# Patient Record
Sex: Male | Born: 1980 | Race: White | Hispanic: No | Marital: Married | Smoking: Current every day smoker
Health system: Southern US, Community
[De-identification: ages and names within clinical notes are randomized; demographics above are authoritative.]

---

## 2015-11-21 ENCOUNTER — Encounter (HOSPITAL_COMMUNITY): Payer: Self-pay

## 2015-11-21 ENCOUNTER — Emergency Department (HOSPITAL_COMMUNITY): Payer: Self-pay

## 2015-11-21 ENCOUNTER — Emergency Department (HOSPITAL_COMMUNITY)
Admission: EM | Admit: 2015-11-21 | Discharge: 2015-11-21 | Disposition: A | Discharge status: Home / Self Care | Payer: Self-pay | Attending: Emergency Medicine | Admitting: Emergency Medicine

## 2015-11-21 DIAGNOSIS — R6883 Chills (without fever): Secondary | ICD-10-CM | POA: Insufficient documentation

## 2015-11-21 DIAGNOSIS — N50812 Left testicular pain: Secondary | ICD-10-CM | POA: Insufficient documentation

## 2015-11-21 DIAGNOSIS — F17200 Nicotine dependence, unspecified, uncomplicated: Secondary | ICD-10-CM | POA: Insufficient documentation

## 2015-11-21 DIAGNOSIS — R590 Localized enlarged lymph nodes: Secondary | ICD-10-CM | POA: Insufficient documentation

## 2015-11-21 DIAGNOSIS — Z79899 Other long term (current) drug therapy: Secondary | ICD-10-CM | POA: Insufficient documentation

## 2015-11-21 DIAGNOSIS — R300 Dysuria: Secondary | ICD-10-CM | POA: Insufficient documentation

## 2015-11-21 DIAGNOSIS — R102 Pelvic and perineal pain: Secondary | ICD-10-CM | POA: Insufficient documentation

## 2015-11-21 DIAGNOSIS — N50811 Right testicular pain: Secondary | ICD-10-CM | POA: Insufficient documentation

## 2015-11-21 DIAGNOSIS — R231 Pallor: Secondary | ICD-10-CM | POA: Insufficient documentation

## 2015-11-21 LAB — URINALYSIS, ROUTINE W REFLEX MICROSCOPIC
Bilirubin Urine: NEGATIVE
Glucose, UA: 100 mg/dL — AB
Hgb urine dipstick: NEGATIVE
Ketones, ur: NEGATIVE mg/dL
LEUKOCYTES UA: NEGATIVE
Nitrite: NEGATIVE
PROTEIN: NEGATIVE mg/dL
Specific Gravity, Urine: >1.03 — ABNORMAL HIGH (ref 1.005–1.030)
pH: 6 (ref 5.0–8.0)

## 2015-11-21 LAB — CBC WITH DIFFERENTIAL/PLATELET
BASOS ABS: 0 10*3/uL (ref 0.0–0.1)
Basophils Relative: 0 %
EOS ABS: 0.1 10*3/uL (ref 0.0–0.7)
EOS PCT: 2 %
HCT: 41.7 % (ref 39.0–52.0)
Hemoglobin: 14 g/dL (ref 13.0–17.0)
LYMPHS PCT: 33 %
Lymphs Abs: 2.4 10*3/uL (ref 0.7–4.0)
MCH: 31 pg (ref 26.0–34.0)
MCHC: 33.6 g/dL (ref 30.0–36.0)
MCV: 92.5 fL (ref 78.0–100.0)
Monocytes Absolute: 0.7 10*3/uL (ref 0.1–1.0)
Monocytes Relative: 9 %
NEUTROS PCT: 56 %
Neutro Abs: 4.1 10*3/uL (ref 1.7–7.7)
PLATELETS: 200 10*3/uL (ref 150–400)
RBC: 4.51 MIL/uL (ref 4.22–5.81)
RDW: 12.8 % (ref 11.5–15.5)
WBC: 7.3 10*3/uL (ref 4.0–10.5)

## 2015-11-21 LAB — COMPREHENSIVE METABOLIC PANEL
ALT: 11 U/L — ABNORMAL LOW (ref 17–63)
AST: 19 U/L (ref 15–41)
Albumin: 4.1 g/dL (ref 3.5–5.0)
Alkaline Phosphatase: 63 U/L (ref 38–126)
Anion gap: 6 (ref 5–15)
BILIRUBIN TOTAL: 0.4 mg/dL (ref 0.3–1.2)
BUN: 21 mg/dL — AB (ref 6–20)
CO2: 27 mmol/L (ref 22–32)
CREATININE: 0.75 mg/dL (ref 0.61–1.24)
Calcium: 9.1 mg/dL (ref 8.9–10.3)
Chloride: 108 mmol/L (ref 101–111)
Glucose, Bld: 161 mg/dL — ABNORMAL HIGH (ref 65–99)
POTASSIUM: 4.3 mmol/L (ref 3.5–5.1)
Sodium: 141 mmol/L (ref 135–145)
TOTAL PROTEIN: 6.8 g/dL (ref 6.5–8.1)

## 2015-11-21 MED ORDER — SODIUM CHLORIDE 0.9 % IV BOLUS (SEPSIS)
1000.0000 mL | Freq: Once | INTRAVENOUS | Status: AC
  Administered 2015-11-21: 1000 mL via INTRAVENOUS

## 2015-11-21 MED ORDER — HYDROCODONE-ACETAMINOPHEN 5-325 MG PO TABS: 1.0000 | ORAL_TABLET | Freq: Four times a day (QID) | ORAL | Status: DC | PRN

## 2015-11-21 MED ORDER — HYDROCODONE-ACETAMINOPHEN 5-325 MG PO TABS
1.0000 | ORAL_TABLET | Freq: Four times a day (QID) | ORAL | Status: AC | PRN
Start: 2015-11-21 — End: ?

## 2015-11-21 MED ORDER — FENTANYL CITRATE (PF) 100 MCG/2ML IJ SOLN
50.0000 ug | Freq: Once | INTRAMUSCULAR | Status: AC
  Administered 2015-11-21: 50 ug via INTRAVENOUS
  Filled 2015-11-21: qty 2

## 2015-11-21 MED ORDER — IOHEXOL 300 MG/ML  SOLN
100.0000 mL | Freq: Once | INTRAMUSCULAR | Status: AC | PRN
  Administered 2015-11-21: 100 mL via INTRAVENOUS

## 2015-11-21 MED ORDER — IOHEXOL 300 MG/ML  SOLN
50.0000 mL | Freq: Once | INTRAMUSCULAR | Status: AC | PRN
  Administered 2015-11-21: 50 mL via ORAL

## 2015-11-21 NOTE — ED Notes (Signed)
Having pain in my left groin, a knot in my right groin, pain in my testicles, and having painful urination with inability to start urination without pushing it out.  Started a couple of weeks ago with pain in the left groin and the pain has increased over the past two days.

## 2015-11-21 NOTE — Discharge Instructions (Signed)
As discussed, today's evaluation has been largely reassuring.  However, with your ongoing pain in the groin area it is very important that you follow-up with our urology colleagues for further evaluation and management.  Return here for concerning changes in your condition.  [employer] 211 is a great source of information about community services available.  Access by dialing 2-1-1 from anywhere in [place], or by website -  PooledIncome.pl.   Other Local Resources (Updated 09/2015)  Financial Assistance   Services    Phone Number and Address  [facility]  Low-cost medical care - 1st and 3rd Saturday of every month  Must not qualify for public or private insurance and must have limited income [phone] [address] [place], [place]    [place] [employer] of Social Services  Child care  Emergency assistance for housing and [employer]  Medicaid [phone] [address] [place], [place] [address]   [facility]  Low-cost medical care for children, communicable diseases, sexually-transmitted diseases, immunizations, maternity care, womens health and family planning [phone] [address] [place], [place] [address]  [facility]   Medication assistance for [place] residents  Must meet income requirements [phone] [address] [place], [place].    [facility] Social Services  Child care  Emergency assistance for housing and [employer]  Medicaid [phone] [address] [place], [place] [address]  [facility]   Low-cost medical care,   Monday through Friday, 9 am to 6 pm.   Accepts Medicare/Medicaid, and self-pay [phone] [address] [place], [place] [address]  [facility]  Low-cost medical  care - Monday through Friday, 8:30 am - 5:30 pm  Accepts Medicaid and self-pay [phone] [address] [place], [place] [address]   [facility]  Primary medical care, including for those with sickle cell disease  Accepts Medicare, Medicaid, insurance and self-pay [phone] [address] [place], [place]  [facility]   Primary medical care  Accepts Medicare, [place], insurance and self-pay [phone] [address] [place], [place] [address]   [facility] of Social Services  Child care  Emergency assistance for housing and [employer]  Medicaid [phone] [address] [place], [place] [address]  [facility] Department of Health and [employer]  Child care  Emergency assistance for housing and [employer]  Medicaid [phone] [address] [place], [place] [address]   [facility] Medication Assistance Program  Medication assistance for [facility] residents with no insurance only  Must have a primary care doctor [phone] [address] [place], [place]  [facility]   Primary medical care  [place], [place], insurance  [phone] [address] [place], [place]  [facility]   Medication assistance [phone]  [facility]   Primary medical care  Accepts Medicare, [place], insurance and self-pay [phone] [address] [place], [place] [address]  [facility]   Primary medical care  Accepts Medicare, [place], insurance and self-pay [phone] [address] [place], [place] [address]  [facility]  For [place] residents between the ages of 17 and 76 who do not have any form of health insurance, Medicare, [place], or [place] benefits.  Services are provided free of charge  to uninsured patients who fall within federal  poverty guidelines.    Hours: Tuesdays and Thursdays, 4:15 - 8 pm [phone] [address], [address] [place], [place] [address]  [facility]     Primary medical care  Dental care  Nutritional counseling  Pharmacy  Accepts Medicaid, Medicare, most insurance.  Fees are adjusted based on ability to pay.   [phone] [facility] [address] [place], [place]  [phone] [facility] [address] [place], [place]  [phone] [facility] [place], [place]  [phone] [facility], [address] [place], [place]  [phone] [facility] [address] [place], [place]  [facility]  Womens health and family planning [phone] [place], [place]  [facility] Social Services  Child care  Emergency assistance for housing and [employer]ark[employer]ilities  Food stamps  Medicaid [phone], [place], [place] [address]   [facility]    Ages 2818 and older  Hours: Mondays and Thursdays, 7:00 am - 9:00 am Patients are seen on a first come, first served basis. [phone] [address] [place], [place]  [facility] of Social Services  Child care  Emergency assistance for housing and [employer]ark[employer]ilities  Food stamps  Medicaid [phone] [address] [place], [place] [address]  [facility] [employer]  Medication assistance  Rental assistance  Food pantry  Medication assistance  Housing assistance  Emergency food distribution  Utility assistance [phone] [address] [place], [place]  [phone]  [address] [place], [place] [address] Hours: Tuesdays and Thursdays from 9am - 12 noon by appointment only  [phone] [address] [place], [place] [address]  [facility] - Lanae Boastlara F. Gunn   Accepts  private insurance, [place], and [place].  Payment is based on a sliding scale for those without insurance.  Hours: Mondays, Tuesdays and Thursdays, 8:30 am - 5:30 pm.   [phone] [address] [place], [place]  [facility] - [facility] Medicine at [facility]    Accepts private insurance, [place], and [place].  Payment is based on a sliding scale for those without insurance. [phone] [address] [place], [place]  [facility] - Pediatrics at E. Scientist, research (physical sciences)[place]  Accepts private insurance, [employer]icare, and [place]icaid.  Payment is based on a sliding scale for those without insurance [phone] [address], [place], [place]  [facility] - [facility]s at [facility]  Accepts private insurance, [employer]icare, and [place].  Payment is based on a sliding scale for those without insurance. [phone] [address] [place], [place]  [facility] - Pediatrics at [facility]  Accepts private insurance, [place], and [place].  Payment is based on a sliding scale for those without insurance. [phone], [phone] [place], [place].    [facility]  Maternity care.  Accepts Medicaid and self-pay. [phone] [address] [place], [place]

## 2015-11-21 NOTE — ED Provider Notes (Signed)
CSN: [id number]     Arrival date & time 11/21/15  1908 History   First MD Initiated Contact with Patient 11/21/15 1918     Chief Complaint  Patient presents with  . Groin Pain     (Consider location/radiation/quality/duration/timing/severity/associated sxs/prior Treatment) HPI Patient presents with term groin pain, dysuria, difficulty with starting urinary stream. Symptoms began maybe 2 weeks ago, have progressed, and particular past 2 days. Patient initially had pain in the left inguinal crease, eventually began to have pain in his testicles, as well as in his lower abdomen. Subsequent, patient developed dysuria, and in the past 24 hours has noticed swelling in the right inguinal crease. He denies hematuria, discharge. No rectal symptoms, no other abdominal pain. There are associated chills. Patient denies weight loss, weight gain. Patient is a smoker  Smoking cessation provided, particularly in light of this patient's evaluation in the ED. Patient has a notable family history of multiple family members with cancer, patient's wife is actively receiving chemotherapy for T-cell lymphoma.   History reviewed. No pertinent past medical history. History reviewed. No pertinent past surgical history. No family history on file. Social History  Substance Use Topics  . Smoking status: Current Every Day Smoker  . Smokeless tobacco: None  . Alcohol Use: No    Review of Systems  Constitutional:       Per HPI, otherwise negative  HENT:       Per HPI, otherwise negative  Respiratory:       Per HPI, otherwise negative  Cardiovascular:       Per HPI, otherwise negative  Gastrointestinal: Negative for vomiting.  Endocrine:       Negative aside from HPI  Genitourinary:       Neg aside from HPI   Musculoskeletal:       Per HPI, otherwise negative  Skin: Positive for pallor.  Allergic/Immunologic: Negative for immunocompromised state.  Neurological: Negative for syncope.       Allergies  Review of patient's allergies indicates no known allergies.  Home Medications   Prior to Admission medications   Medication Sig Start Date End Date Taking? Authorizing Provider  gabapentin (NEURONTIN) 300 MG capsule Take 300 mg by mouth 3 (three) times daily.   Yes Historical Provider, MD  naproxen sodium (ALEVE) 220 MG tablet Take 220 mg by mouth daily as needed (for pain).   Yes Historical Provider, MD   BP 137/89 mmHg  Pulse 95  Temp(Src) 97.5 F (36.4 C) (Oral)  Resp 20  Ht 6\' 1"  (1.854 m)  Wt 235 lb (106.595 kg)  BMI 31.01 kg/m2  SpO2 99% Physical Exam  Constitutional: He is oriented to person, place, and time. He appears well-developed. No distress.  HENT:  Head: Normocephalic and atraumatic.  Eyes: Conjunctivae and EOM are normal.  Cardiovascular: Normal rate and regular rhythm.   Pulmonary/Chest: Effort normal. No stridor. No respiratory distress.  Abdominal: He exhibits no distension.  Genitourinary: Penis normal.    Right testis shows no mass and no tenderness. Left testis shows no mass and no tenderness.  Musculoskeletal: He exhibits no edema.  Lymphadenopathy:       Right: Inguinal adenopathy present.       Left: Inguinal adenopathy present.  Neurological: He is alert and oriented to person, place, and time.  Skin: Skin is warm and dry.  Psychiatric: He has a normal mood and affect.  Nursing note and vitals reviewed.   ED Course  Procedures (including critical care time) Labs Review Labs  Reviewed  COMPREHENSIVE METABOLIC PANEL - Abnormal; Notable for the following:    Glucose, Bld 161 (*)    BUN 21 (*)    ALT 11 (*)    All other components within normal limits  URINALYSIS, ROUTINE W REFLEX MICROSCOPIC (NOT AT [facility]) - Abnormal; Notable for the following:    Specific Gravity, Urine >1.030 (*)    Glucose, UA 100 (*)    All other components within normal limits  CBC WITH DIFFERENTIAL/PLATELET    Imaging Review Ct Abdomen Pelvis W  Contrast  11/21/2015  CLINICAL DATA:  Pain bilateral inguinal regions and bilateral testicles, painful urination, pain getting worse for 2 days EXAM: CT ABDOMEN AND PELVIS WITH CONTRAST TECHNIQUE: Multidetector CT imaging of the abdomen and pelvis was performed using the standard protocol following bolus administration of intravenous contrast. CONTRAST:  50mL OMNIPAQUE IOHEXOL 300 MG/ML SOLN, OMNIPAQUE IOHEXOL 300 MG/ML SOLN COMPARISON:  None. FINDINGS: Lower chest:  Normal Hepatobiliary: Normal Pancreas: Normal Spleen: Multiple splenules in addition to normal spleen Adrenals/Urinary Tract: Adrenals are normal. No perinephric inflammation or hydronephrosis. Along the course of the left ureter, there is a 2 mm calcification within or adjacent to the mid left ureter. It could represent a ureteral stone or a phlebolith. Based on position in the absence of hydronephrosis a phlebolith is considered more likely. Bladder is normal. Stomach/Bowel: Normal Vascular/Lymphatic: No evidence of aortic dilatation. Mild right iliac artery calcification. No significant adenopathy. Reproductive: No significant findings. Bilateral upper scrotal calcifications along the course of the vas deferens not of acute significance. Other: No ascites Musculoskeletal: No acute findings. Significant L3-4 and L4-5 degenerative disc disease. IMPRESSION: The study is felt to be negative for acute abnormalities. 2 mm calcification over the left side OS appears to be a adjacent rather than within the left ureter, which is not dilated. No evidence of perinephric inflammation or hydronephrosis. Electronically Signed   By: Esperanza Heir M.D.   On: 11/21/2015 20:42   I have personally reviewed and evaluated these images and lab results as part of my medical decision-making.   On repeat exam the patient is in no distress. I discussed all findings with him and his wife. Specifically we discussed the reassuring CT findings, labs,  ultrasound. Given the patient continues to have some pain, he will follow-up with urology.  MDM  Patient presents with ongoing groin pain. Here, the patient is awake, alert, with a largely reassuring physical exam, though there is tenderness to palpation about both sides of the inguinal area, and with some concern for adenopathy, CT scans performed to exclude deep infection. Patient is no appreciable scrotal tenderness to palpation, and testicular torsion is not likely. Patient had pain control provided here, was discharged with similar medication to follow-up with urology for further evaluation, management.   Gerhard Munch, MD 11/21/15 2101

## 2015-11-24 MED FILL — Hydrocodone-Acetaminophen Tab 5-325 MG: ORAL | Qty: 6 | Status: AC

## 2016-09-24 IMAGING — CT CT ABD-PELV W/ CM
2 of 4 series · 16 of 46 positions shown, 18 images · IV contrast (Omnipaque 300)
Comparison: None.

CLINICAL DATA: Pain bilateral inguinal regions and bilateral
testicles, painful urination, pain getting worse for 2 days

EXAM:
CT ABDOMEN AND PELVIS WITH CONTRAST
TECHNIQUE: Multidetector CT imaging of the abdomen and pelvis was performed
using the standard protocol following bolus administration of
intravenous contrast.
CONTRAST:  50mL OMNIPAQUE IOHEXOL 300 MG/ML SOLN, 100mL OMNIPAQUE
IOHEXOL 300 MG/ML SOLN

[Series 2: abd_pel_with 5.0 b40f · axial · 0.69mm/px · z∈[+636,+1106]mm · 13 of 102 slices shown, 15 images]
[im 4/102  soft-tissue]
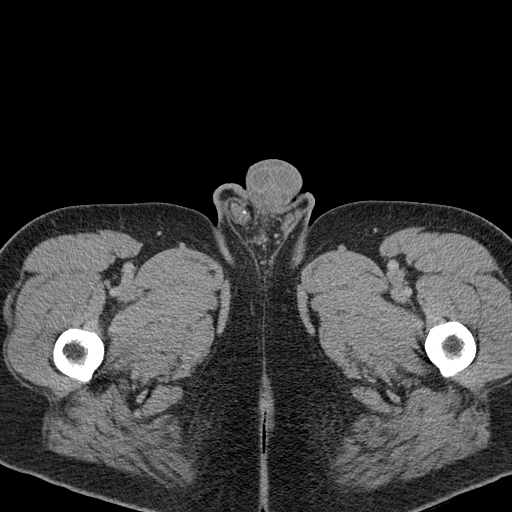
[im 4/102  bone]
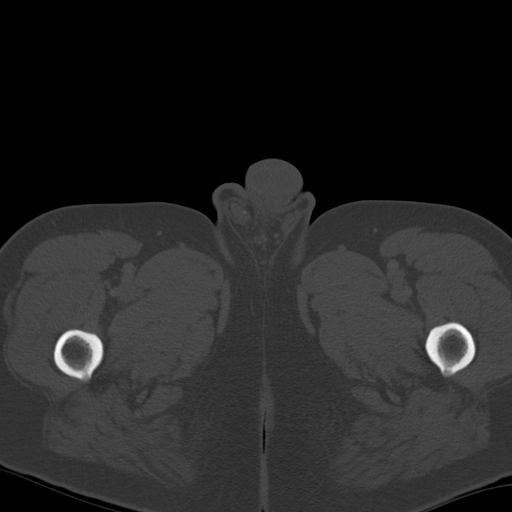
[im 12/102  soft-tissue]
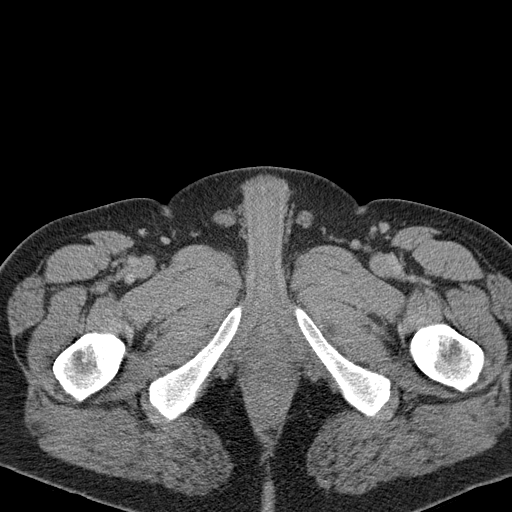
[im 20/102  soft-tissue]
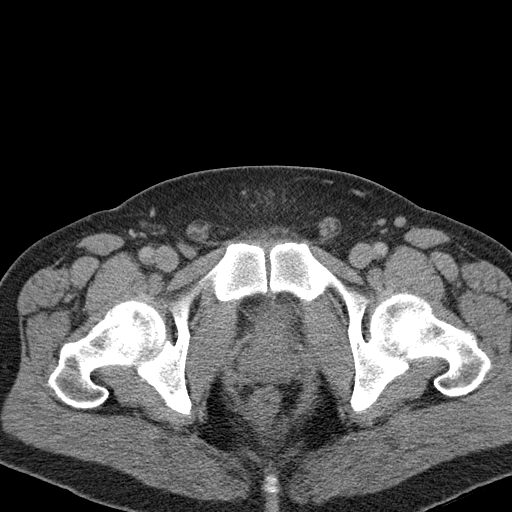
[im 28/102  soft-tissue]
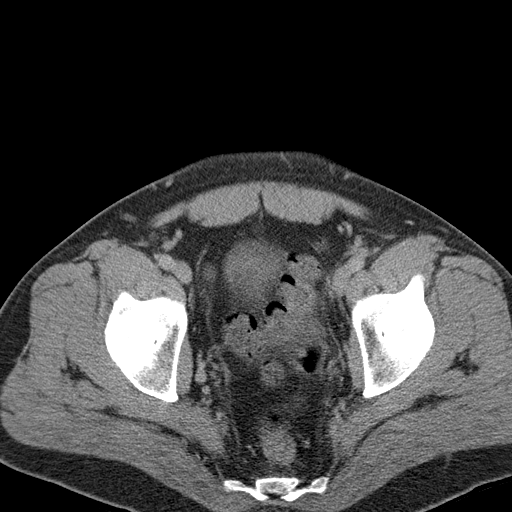
[im 35/102  soft-tissue]
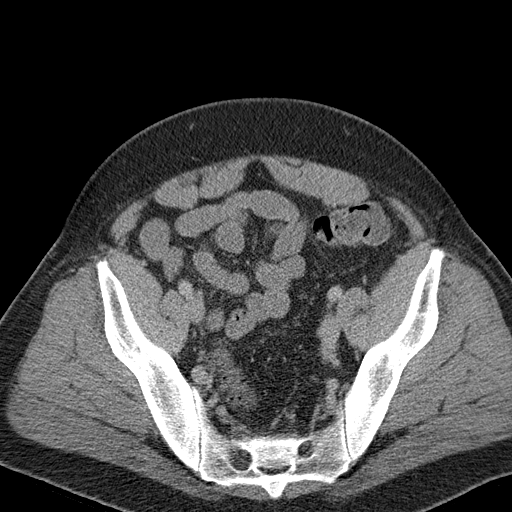
[im 43/102  soft-tissue]
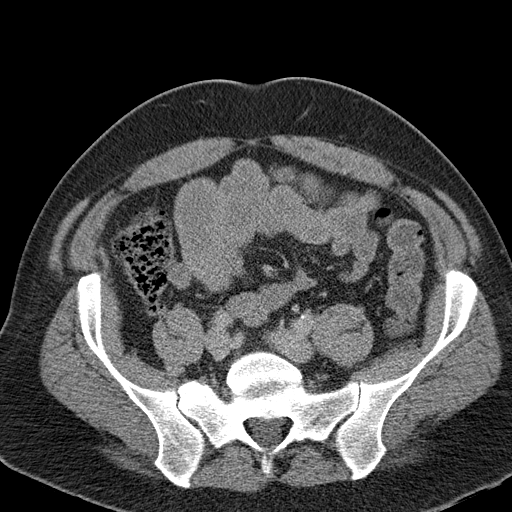
[im 51/102  soft-tissue]
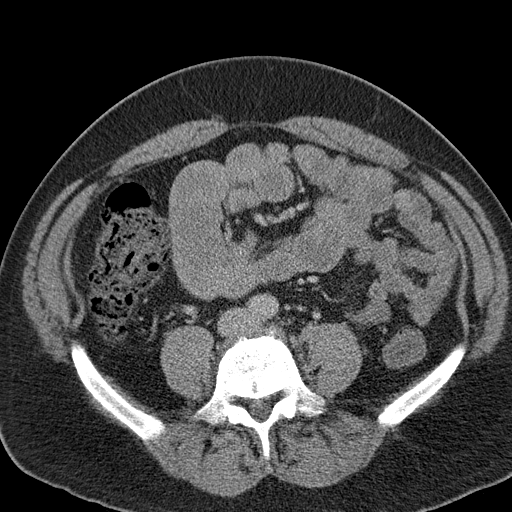
[im 59/102  soft-tissue]
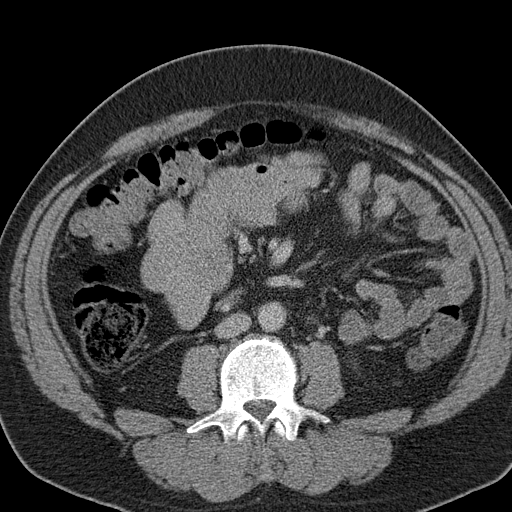
[im 67/102  soft-tissue]
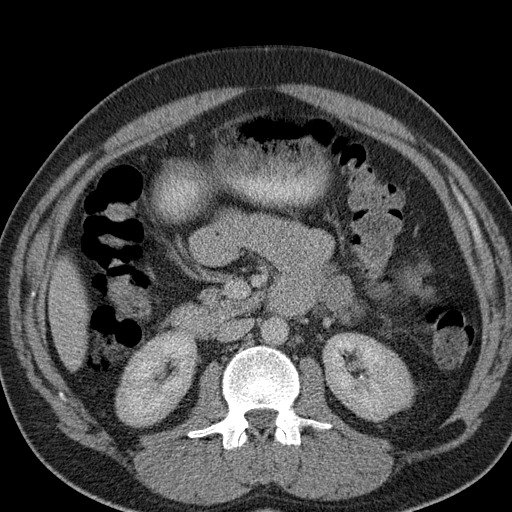
[im 67/102  bone]
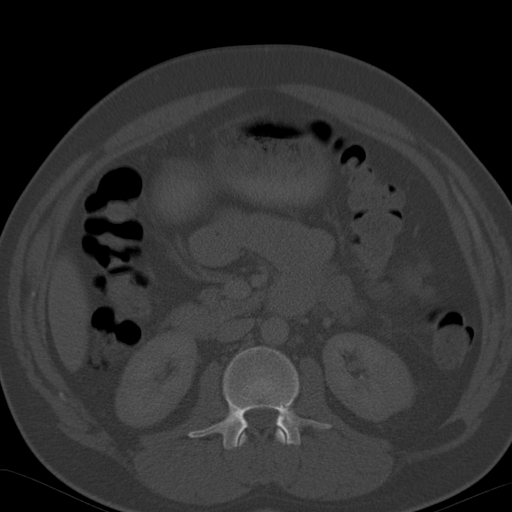
[im 74/102  soft-tissue]
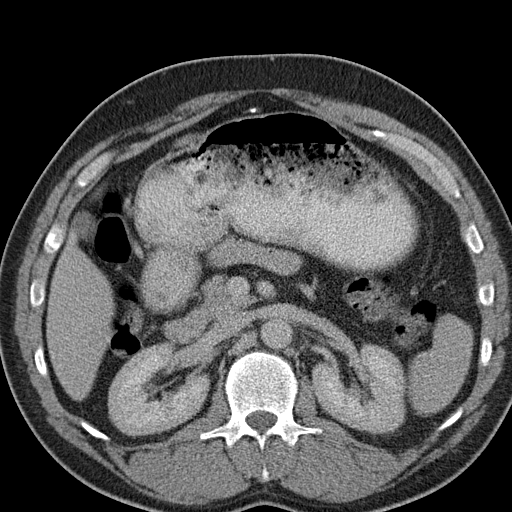
[im 82/102  soft-tissue]
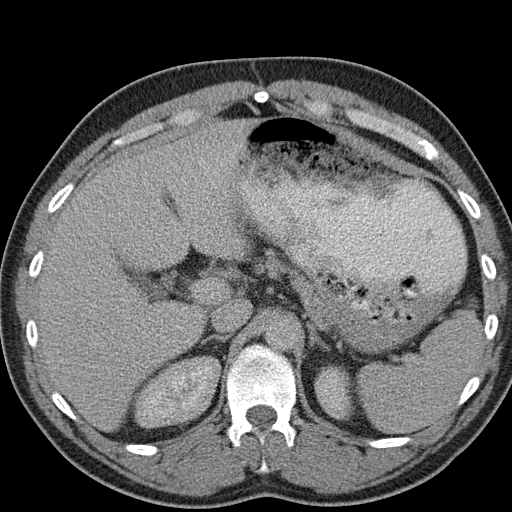
[im 90/102  soft-tissue]
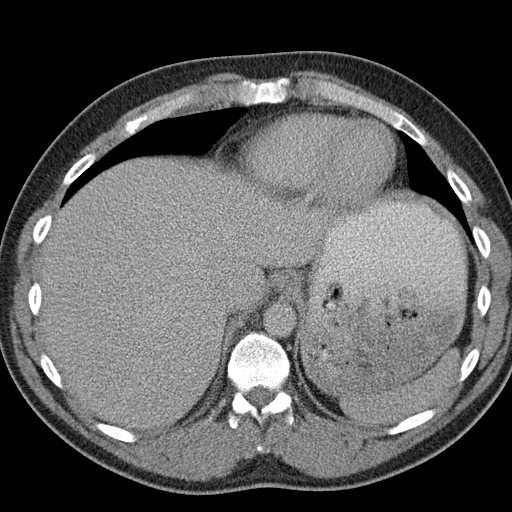
[im 98/102  soft-tissue]
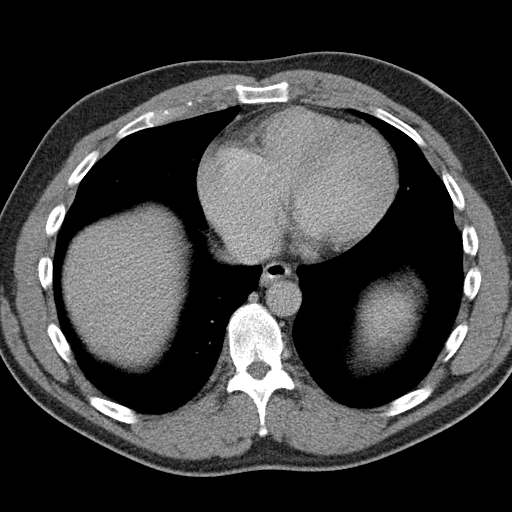

[Series 3: abd_pel_with 3.0 spo cor · coronal · 0.71mm/px · 3 of 113 slices shown]
[im 38/113  soft-tissue]
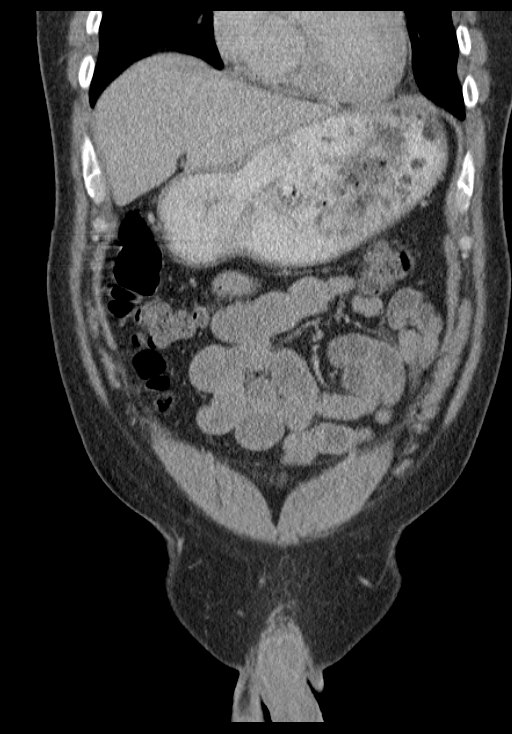
[im 50/113  soft-tissue]
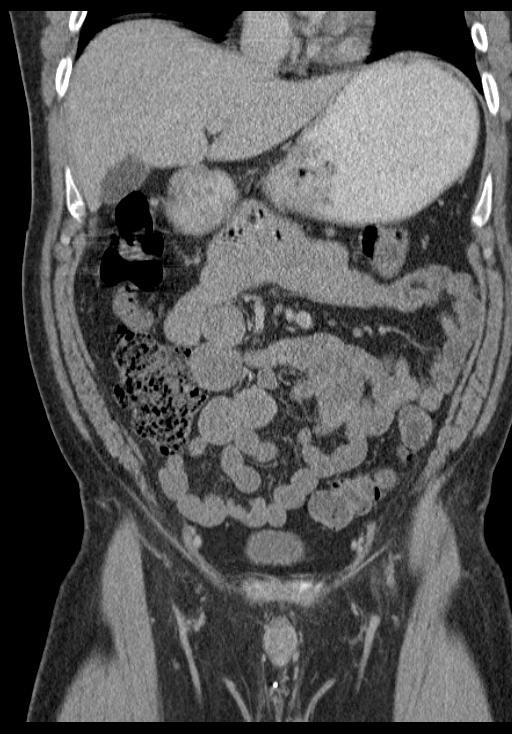
[im 63/113  soft-tissue]
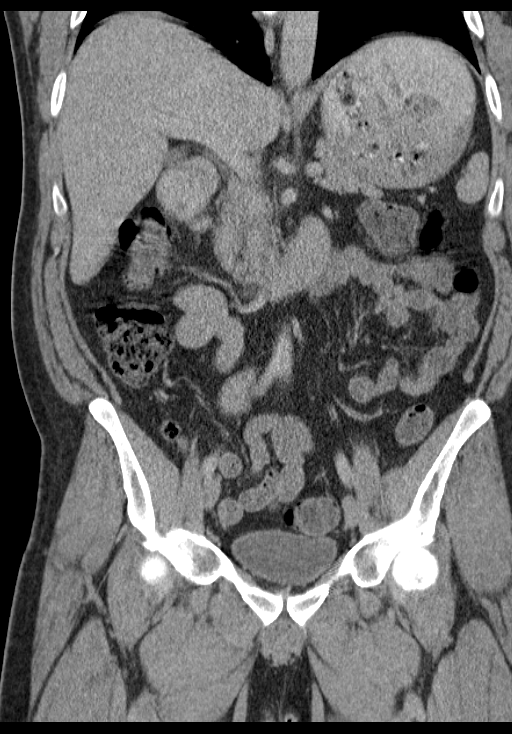

[16 of 46 positions shown; findings below may reference images not displayed]

FINDINGS: Lower chest:  Normal

Hepatobiliary: Normal

Pancreas: Normal

Spleen: Multiple splenules in addition to normal spleen

Adrenals/Urinary Tract: Adrenals are normal. No perinephric
inflammation or hydronephrosis. Along the course of the left ureter,
there is a 2 mm calcification within or adjacent to the mid left
ureter. It could represent a ureteral stone or a phlebolith. Based
on position in the absence of hydronephrosis a phlebolith is
considered more likely. Bladder is normal.

Stomach/Bowel: Normal

Vascular/Lymphatic: No evidence of aortic dilatation. Mild right
iliac artery calcification. No significant adenopathy.

Reproductive: No significant findings. Bilateral upper scrotal
calcifications along the course of the vas deferens not of acute
significance.

Other: No ascites

Musculoskeletal: No acute findings. Significant L3-4 and L4-5
degenerative disc disease.
IMPRESSION: The study is felt to be negative for acute abnormalities. 2 mm
calcification over the left side OS appears to be a adjacent rather
than within the left ureter, which is not dilated. No evidence of
perinephric inflammation or hydronephrosis.

## 2021-08-20 DEATH — deceased
